# Patient Record
Sex: Male | Born: 1995 | Race: White | Hispanic: No | Marital: Single | Smoking: Never smoker
Health system: Southern US, Community
[De-identification: ages and names within clinical notes are randomized; demographics above are authoritative.]

---
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
## 2021-02-28 ENCOUNTER — Other Ambulatory Visit: Payer: Self-pay

## 2021-02-28 ENCOUNTER — Encounter (HOSPITAL_COMMUNITY): Payer: Self-pay | Admitting: Emergency Medicine

## 2021-02-28 ENCOUNTER — Emergency Department (HOSPITAL_COMMUNITY): Payer: Self-pay

## 2021-02-28 ENCOUNTER — Emergency Department (HOSPITAL_COMMUNITY)
Admission: EM | Admit: 2021-02-28 | Discharge: 2021-03-01 | Disposition: A | Discharge status: Home / Self Care | Payer: Self-pay | Attending: Emergency Medicine | Admitting: Emergency Medicine

## 2021-02-28 DIAGNOSIS — R002 Palpitations: Secondary | ICD-10-CM | POA: Insufficient documentation

## 2021-02-28 DIAGNOSIS — F1910 Other psychoactive substance abuse, uncomplicated: Secondary | ICD-10-CM | POA: Insufficient documentation

## 2021-02-28 DIAGNOSIS — R000 Tachycardia, unspecified: Secondary | ICD-10-CM | POA: Insufficient documentation

## 2021-02-28 LAB — BASIC METABOLIC PANEL
Anion gap: 8 (ref 5–15)
BUN: 13 mg/dL (ref 6–20)
CO2: 29 mmol/L (ref 22–32)
Calcium: 10 mg/dL (ref 8.9–10.3)
Chloride: 100 mmol/L (ref 98–111)
Creatinine, Ser: 0.98 mg/dL (ref 0.61–1.24)
GFR, Estimated: >60 mL/min (ref >60)
Glucose, Bld: 130 mg/dL — ABNORMAL HIGH (ref 70–99)
Potassium: 4.1 mmol/L (ref 3.5–5.1)
Sodium: 137 mmol/L (ref 135–145)

## 2021-02-28 LAB — CBC
HCT: 46.5 % (ref 39.0–52.0)
Hemoglobin: 15.8 g/dL (ref 13.0–17.0)
MCH: 29.4 pg (ref 26.0–34.0)
MCHC: 34 g/dL (ref 30.0–36.0)
MCV: 86.6 fL (ref 80.0–100.0)
Platelets: 230 10*3/uL (ref 150–400)
RBC: 5.37 MIL/uL (ref 4.22–5.81)
RDW: 11.9 % (ref 11.5–15.5)
WBC: 9.7 10*3/uL (ref 4.0–10.5)
nRBC: 0 % (ref 0.0–0.2)

## 2021-02-28 LAB — TROPONIN I (HIGH SENSITIVITY): Troponin I (High Sensitivity): 3 ng/L (ref <18)

## 2021-02-28 NOTE — ED Provider Notes (Signed)
Emergency Medicine Provider Triage Evaluation Note  Taryll Reichenberger , a 25 y.o. male  was evaluated in triage.  Pt complains of his heart racing.  Pt reports he used cocaine and marijuana   Review of Systems  Positive: Chest discomfort Negative: Fever or cough  Physical Exam  BP (!) 153/102 (BP Location: Left Arm)   Pulse (!) 106   Temp 98.2 F (36.8 C) (Oral)   Resp 18   Ht 5\' 11"  (1.803 m)   Wt 68 kg   SpO2 99%   BMI 20.92 kg/m  Gen:   Awake, no distress   Resp:  Normal effort  MSK:   Moves extremities without difficulty  Other:  Hear rate 110   Medical Decision Making  Medically screening exam initiated at 11:13 PM.  Appropriate orders placed.  Devlyn Retter was informed that the remainder of the evaluation will be completed by another provider, this initial triage assessment does not replace that evaluation, and the importance of remaining in the ED until their evaluation is complete.     Joellen Jersey 02/28/21 2314    05/01/21, MD 03/01/21 1919

## 2021-02-28 NOTE — ED Triage Notes (Addendum)
Patient states that he has snorted cocaine and smoked some marijuana. Patient states his heart is beating fast. HR-110. Patient also complains of chest pain on the left side.

## 2021-03-01 LAB — TROPONIN I (HIGH SENSITIVITY): Troponin I (High Sensitivity): 3 ng/L (ref <18)

## 2021-03-01 NOTE — Discharge Instructions (Addendum)
We advise against the use of illicit substances as it may cause recurrence of your symptoms and can be fatal.  Your evaluation in the emergency department was reassuring.  You may return for new or concerning symptoms.

## 2021-03-01 NOTE — ED Provider Notes (Signed)
[facility]-EMERGENCY DEPT Provider Note   CSN: [id number] Arrival date & time: 02/28/21  2152     History Chief Complaint  Patient presents with   Tachycardia    Micheal King is a 25 y.o. male.  25 year old male presents to the emergency department for evaluation of palpitations.  He had onset of his heart beating fast and irregular around 2100.  This was shortly after use of marijuana, though he endorses cocaine use 2 hours prior to symptom onset as well.  No interventions attempted prior to arrival.  He presently is feeling much better.  Denies similar reaction in the past.  No vomiting, syncope, chest pain.  Denies any other coingestions.  The history is provided by the patient. No language interpreter was used.      History reviewed. No pertinent past medical history.  There are no problems to display for this patient.   History reviewed. No pertinent surgical history.     History reviewed. No pertinent family history.  Social History   Tobacco Use   Smoking status: Never    Passive exposure: Never   Smokeless tobacco: Never  Vaping Use   Vaping Use: Every day  Substance Use Topics   Alcohol use: Yes   Drug use: Yes    Types: Cocaine, Marijuana    Home Medications Prior to Admission medications   Medication Sig Start Date End Date Taking? Authorizing Provider  promethazine-dextromethorphan (PROMETHAZINE-DM) 6.25-15 MG/5ML syrup Take 43ml at bedtime for cough 08/23/15  Yes [provider]    Allergies    Azithromycin and Penicillins  Review of Systems   Review of Systems Ten systems reviewed and are negative for acute change, except as noted in the HPI.    Physical Exam Updated Vital Signs BP 109/71   Pulse 61   Temp 98.2 F (36.8 C) (Oral)   Resp 15   Ht 5\' 11"  (1.803 m)   Wt 68 kg   SpO2 97%   BMI 20.92 kg/m   Physical Exam Vitals and nursing note reviewed.  Constitutional:      General: He is not in acute  distress.    Appearance: He is well-developed. He is not diaphoretic.     Comments: Calm and cooperative  HENT:     Head: Normocephalic and atraumatic.  Eyes:     General: No scleral icterus.    Conjunctiva/sclera: Conjunctivae normal.  Cardiovascular:     Rate and Rhythm: Normal rate and regular rhythm.     Pulses: Normal pulses.  Pulmonary:     Effort: Pulmonary effort is normal. No respiratory distress.     Breath sounds: No stridor. No wheezing or rales.     Comments: Respirations even and unlabored. Lungs CTAB. Musculoskeletal:        General: Normal range of motion.     Cervical back: Normal range of motion.  Skin:    General: Skin is warm and dry.     Coloration: Skin is not pale.     Findings: No erythema or rash.  Neurological:     Mental Status: He is alert and oriented to person, place, and time.     Coordination: Coordination normal.  Psychiatric:        Behavior: Behavior normal.    ED Results / Procedures / Treatments   Labs (all labs ordered are listed, but only abnormal results are displayed) Labs Reviewed  BASIC METABOLIC PANEL - Abnormal; Notable for the following components:  Result Value   Glucose, Bld 130 (*)    All other components within normal limits  CBC  TROPONIN I (HIGH SENSITIVITY)  TROPONIN I (HIGH SENSITIVITY)    EKG None  Radiology DG Chest 2 View  Result Date: 02/28/2021 CLINICAL DATA:  Palpitations in left-sided chest pain EXAM: CHEST - 2 VIEW COMPARISON:  None. FINDINGS: The heart size and mediastinal contours are within normal limits. Both lungs are clear. The visualized skeletal structures are unremarkable. IMPRESSION: No active cardiopulmonary disease. Electronically Signed   By: Maudry Mayhew MD   On: 02/28/2021 22:33    Procedures Procedures   Medications Ordered in ED Medications - No data to display  ED Course  I have reviewed the triage vital signs and the nursing notes.  Pertinent labs & imaging results that  were available during my care of the patient were reviewed by me and considered in my medical decision making (see chart for details).    MDM Rules/Calculators/A&P                          Palpitations brought on following polysubstance abuse with cocaine and marijuana.  Presently in normal sinus rhythm and patient reporting resolution of his palpitations.  He states he is feeling much better.  Evaluation in the emergency department has been reassuring with negative troponin x2.  Chest x-ray negative for acute cardiopulmonary abnormality.  EKG on arrival did show fast sinus arrhythmia, but no changes to suggest acute ischemia.  Patient advised to discontinue use of illicit substances.  Stable for discharge to follow-up with a primary care doctor as needed.   Final Clinical Impression(s) / ED Diagnoses Final diagnoses:  Tachycardia  Polysubstance abuse [facility])    Rx / DC Orders ED Discharge Orders     None        Antony Madura, PA-C 03/01/21 Otelia Santee, April, MD 03/01/21 [phone]

## 2021-03-01 NOTE — ED Notes (Signed)
ED Provider at bedside. 

## 2021-09-15 ENCOUNTER — Other Ambulatory Visit: Payer: Self-pay

## 2021-09-15 ENCOUNTER — Encounter (HOSPITAL_COMMUNITY): Payer: Self-pay

## 2021-09-15 ENCOUNTER — Emergency Department (HOSPITAL_COMMUNITY)
Admission: EM | Admit: 2021-09-15 | Discharge: 2021-09-16 | Disposition: A | Discharge status: Home / Self Care | Payer: Self-pay | Attending: Emergency Medicine | Admitting: Emergency Medicine

## 2021-09-15 DIAGNOSIS — W260XXA Contact with knife, initial encounter: Secondary | ICD-10-CM | POA: Insufficient documentation

## 2021-09-15 DIAGNOSIS — S61213A Laceration without foreign body of left middle finger without damage to nail, initial encounter: Secondary | ICD-10-CM | POA: Insufficient documentation

## 2021-09-15 DIAGNOSIS — S61210A Laceration without foreign body of right index finger without damage to nail, initial encounter: Secondary | ICD-10-CM | POA: Insufficient documentation

## 2021-09-15 DIAGNOSIS — Y92000 Kitchen of unspecified non-institutional (private) residence as  the place of occurrence of the external cause: Secondary | ICD-10-CM | POA: Insufficient documentation

## 2021-09-15 NOTE — ED Triage Notes (Signed)
Cut left 2 and 3 finger on kitchen knife doing the dishes.

## 2021-09-15 NOTE — ED Provider Triage Note (Signed)
Emergency Medicine Provider Triage Evaluation Note  Micheal King , a 26 y.o. male  was evaluated in triage.  Pt complains of finger lac of R middle and pointer fingers. Minimal bleeding. No numbness or tingling. Pt thinks his tdap is UTD.   Review of Systems  Positive: Finger lac x2 Negative: numbness  Physical Exam  Ht 5\' 11"  (1.803 m)    Wt 68 kg    BMI 20.92 kg/m  Gen:   Awake, no distress   Resp:  Normal effort  MSK:   Moves extremities without difficulty. 1 cm lac to dorsal L index and middle fingers. Full ROM of each joint when held in isolation. Good distal sensation and cap refill.    Medical Decision Making  Medically screening exam initiated at 8:06 PM.  Appropriate orders placed.  Micheal King was informed that the remainder of the evaluation will be completed by another provider, this initial triage assessment does not replace that evaluation, and the importance of remaining in the ED until their evaluation is complete.  Will need repair   Joellen Jersey, PA-C 09/15/21 2007

## 2021-09-16 MED ORDER — LIDOCAINE HCL (PF) 1 % IJ SOLN
10.0000 mL | Freq: Once | INTRAMUSCULAR | Status: AC
  Administered 2021-09-16: 02:00:00 10 mL
  Filled 2021-09-16: qty 30

## 2021-09-16 NOTE — Discharge Instructions (Signed)
Sutures removed in 7-10 days

## 2021-09-16 NOTE — ED Provider Notes (Signed)
[facility]-EMERGENCY DEPT Provider Note   CSN: [id number] Arrival date & time: 09/15/21  1947     History  Chief Complaint  Patient presents with   Finger Laceration    Micheal King is a 26 y.o. male with no significant past medical history here for evaluation of laceration to digit 2, 3 right upper extremity dorsal aspect between PIP and DIP just PTA while cutting dishes.  Unknown last tetanus however declines updating at this time.  Has had bleeding to third digit.  No paresthesias, weakness, full range of motion without difficulty.  HPI     Home Medications Prior to Admission medications   Medication Sig Start Date End Date Taking? Authorizing Provider  promethazine-dextromethorphan (PROMETHAZINE-DM) 6.25-15 MG/5ML syrup Take 11ml at bedtime for cough 08/23/15   [provider]      Allergies    Azithromycin and Penicillins    Review of Systems   Review of Systems  Constitutional: Negative.   HENT: Negative.    Respiratory: Negative.    Cardiovascular: Negative.   Gastrointestinal: Negative.   Genitourinary: Negative.   Musculoskeletal: Negative.   Skin:  Positive for wound.  Neurological: Negative.   All other systems reviewed and are negative.  Physical Exam Updated Vital Signs BP 134/85    Pulse 86    Temp 98.3 F (36.8 C)    Resp 19    Ht 5\' 11"  (1.803 m)    Wt 68 kg    SpO2 98%    BMI 20.92 kg/m  Physical Exam Vitals and nursing note reviewed.  Constitutional:      General: He is not in acute distress.    Appearance: He is well-developed. He is not ill-appearing, toxic-appearing or diaphoretic.  HENT:     Head: Normocephalic and atraumatic.     Nose: Nose normal.     Mouth/Throat:     Mouth: Mucous membranes are moist.  Eyes:     Pupils: Pupils are equal, round, and reactive to light.  Cardiovascular:     Rate and Rhythm: Normal rate and regular rhythm.     Pulses: Normal pulses.     Heart sounds: Normal heart  sounds.  Pulmonary:     Effort: Pulmonary effort is normal. No respiratory distress.  Abdominal:     General: There is no distension.     Palpations: Abdomen is soft.  Musculoskeletal:        General: Normal range of motion.     Right hand: Laceration present. No swelling, tenderness or bony tenderness. Normal range of motion.     Left hand: Normal.       Hands:     Cervical back: Normal range of motion and neck supple.     Comments: Full range of motion, nontender  Skin:    General: Skin is warm and dry.     Comments: 8 millimeters laceration on second and third digit between PIP, DIP right upper extremity.  No active bleeding to second digit, slow ooze to third digit.  No evidence of foreign object.  No nailbed involvement  Neurological:     General: No focal deficit present.     Mental Status: He is alert and oriented to person, place, and time.     Comments: Intact sensation Equal strength    ED Results / Procedures / Treatments   Labs (all labs ordered are listed, but only abnormal results are displayed) Labs Reviewed - No data to display  EKG None  Radiology No results found.  Procedures .Marland Kitchen.Laceration Repair  Date/Time: 09/16/2021 2:05 AM Performed by: Ralph LeydenHenderly, Aviya Jarvie A, PA-C Authorized by: Linwood DibblesHenderly, Hager Compston A, PA-C   Consent:    Consent obtained:  Verbal   Consent given by:  Patient   Risks discussed:  Infection, need for additional repair, pain, poor cosmetic result and poor wound healing   Alternatives discussed:  No treatment and delayed treatment Universal protocol:    Procedure explained and questions answered to patient or proxy's satisfaction: yes     Relevant documents present and verified: yes     Test results available: yes     Imaging studies available: yes     Required blood products, implants, devices, and special equipment available: yes     Site/side marked: yes     Immediately prior to procedure, a time out was called: yes     Patient  identity confirmed:  Verbally with patient Anesthesia:    Anesthesia method:  Local infiltration and nerve block   Local anesthetic:  Lidocaine 1% w/o epi   Block location:  Digital   Block anesthetic:  Lidocaine 1% w/o epi   Block injection procedure:  Anatomic landmarks identified, anatomic landmarks palpated, introduced needle, negative aspiration for blood and incremental injection   Block outcome:  Anesthesia achieved Laceration details:    Location:  Finger   Finger location:  R long finger   Length (cm):  1 Pre-procedure details:    Preparation:  Patient was prepped and draped in usual sterile fashion and imaging obtained to evaluate for foreign bodies Exploration:    Hemostasis achieved with:  Direct pressure   Imaging outcome: foreign body not noted     Wound exploration: wound explored through full range of motion and entire depth of wound visualized     Wound extent: no foreign bodies/material noted, no muscle damage noted, no nerve damage noted, no tendon damage noted, no underlying fracture noted and no vascular damage noted     Contaminated: no   Treatment:    Area cleansed with:  Povidone-iodine   Amount of cleaning:  Extensive   Irrigation solution:  Sterile saline   Irrigation method:  Pressure wash Skin repair:    Repair method:  Sutures   Suture size:  4-0   Suture material:  Prolene   Suture technique:  Simple interrupted   Number of sutures:  3 Approximation:    Approximation:  Close Repair type:    Repair type:  Intermediate Post-procedure details:    Dressing:  Non-adherent dressing   Procedure completion:  Tolerated well, no immediate complications .Nerve Block  Date/Time: 09/16/2021 2:06 AM Performed by: Linwood DibblesHenderly, Manika Hast A, PA-C Authorized by: Linwood DibblesHenderly, Jorah Hua A, PA-C   Consent:    Consent obtained:  Verbal   Consent given by:  Patient   Risks, benefits, and alternatives were discussed: yes     Risks discussed:  Allergic reaction, infection,  nerve damage, swelling, unsuccessful block, pain, intravenous injection and bleeding   Alternatives discussed:  No treatment, delayed treatment, alternative treatment and referral Universal protocol:    Procedure explained and questions answered to patient or proxy's satisfaction: yes     Relevant documents present and verified: yes     Test results available: yes     Imaging studies available: yes     Required blood products, implants, devices, and special equipment available: yes     Site/side marked: yes     Immediately prior to procedure, a time out was called:  yes     Patient identity confirmed:  Verbally with patient Indications:    Indications:  Procedural anesthesia Location:    Body area:  Upper extremity   Upper extremity nerve blocked: digital. Pre-procedure details:    Skin preparation:  Alcohol   Preparation: Patient was prepped and draped in usual sterile fashion   Skin anesthesia:    Skin anesthesia method:  None Procedure details:    Anesthetic injected:  Lidocaine 1% w/o epi   Steroid injected:  None   Additive injected:  None   Injection procedure:  Anatomic landmarks identified, anatomic landmarks palpated, incremental injection, introduced needle and negative aspiration for blood Post-procedure details:    Outcome:  Anesthesia achieved   Procedure completion:  Tolerated well, no immediate complications .Marland Kitchen.Laceration Repair  Date/Time: 09/16/2021 2:07 AM Performed by: Ralph LeydenHenderly, Galena Logie A, PA-C Authorized by: Linwood DibblesHenderly, Aleah Ahlgrim A, PA-C   Consent:    Consent obtained:  Verbal   Consent given by:  Patient   Risks discussed:  Infection, need for additional repair, pain, poor cosmetic result and poor wound healing   Alternatives discussed:  No treatment and delayed treatment Universal protocol:    Procedure explained and questions answered to patient or proxy's satisfaction: yes     Relevant documents present and verified: yes     Test results available: yes      Imaging studies available: yes     Required blood products, implants, devices, and special equipment available: yes     Site/side marked: yes     Immediately prior to procedure, a time out was called: yes     Patient identity confirmed:  Verbally with patient Anesthesia:    Anesthesia method:  None Laceration details:    Location:  Finger   Finger location:  R index finger   Length (cm):  0.8   Depth (mm):  3 Pre-procedure details:    Preparation:  Patient was prepped and draped in usual sterile fashion and imaging obtained to evaluate for foreign bodies Exploration:    Hemostasis achieved with:  Direct pressure   Imaging outcome: foreign body not noted     Wound exploration: wound explored through full range of motion and entire depth of wound visualized     Wound extent: no foreign bodies/material noted, no muscle damage noted, no tendon damage noted, no underlying fracture noted and no vascular damage noted     Contaminated: no   Treatment:    Area cleansed with:  Povidone-iodine   Amount of cleaning:  Extensive   Irrigation solution:  Sterile saline   Irrigation method:  Pressure wash Skin repair:    Repair method:  Tissue adhesive Approximation:    Approximation:  Close Repair type:    Repair type:  Intermediate Post-procedure details:    Dressing:  Non-adherent dressing   Procedure completion:  Tolerated well, no immediate complications    Medications Ordered in ED Medications  lidocaine (PF) (XYLOCAINE) 1 % injection 10 mL (10 mLs Infiltration Given by Other 09/16/21 0142)   ED Course/ Medical Decision Making/ A&P    26 year old here for evaluation #2 lacerations between PIP, DIP on dorsal aspect second/third digit right upper extremity PTA.  Unknown last tetanus however patient declines updating today.  He understands risk involved in updating tetanus.  He is neurovascular intact.  No bony tenderness.  No pulsatile bleeding.  Do not feel patient needs imaging at this  time.  We will plan on closing.  Was able to perform digital block  to the third digit RUE and closed with #3 Prolene sutures, second digit laceration closed with dermabond.  We will have him follow-up for removal, discussed return precautions.  Patient voiced understanding agreeable for follow-up  The patient has been appropriately medically screened and/or stabilized in the ED. I have low suspicion for any other emergent medical condition which would require further screening, evaluation or treatment in the ED or require inpatient management.  Patient is hemodynamically stable and in no acute distress.  Patient able to ambulate in department prior to ED.  Evaluation does not show acute pathology that would require ongoing or additional emergent interventions while in the emergency department or further inpatient treatment.  I have discussed the diagnosis with the patient and answered all questions.  Pain is been managed while in the emergency department and patient has no further complaints prior to discharge.  Patient is comfortable with plan discussed in room and is stable for discharge at this time.  I have discussed strict return precautions for returning to the emergency department.  Patient was encouraged to follow-up with PCP/specialist refer to at discharge.                            Medical Decision Making Risk OTC drugs. Prescription drug management. Risk Details: Do not feel patient needs labs, imaging and hospitalization at this time.         Final Clinical Impression(s) / ED Diagnoses Final diagnoses:  Laceration of right index finger without foreign body without damage to nail, initial encounter  Laceration of left middle finger without foreign body without damage to nail, initial encounter    Rx / DC Orders ED Discharge Orders     None         Jenniger Figiel A, PA-C 09/16/21 0220    Sabas Sous, MD 09/16/21 [phone]

## 2021-11-25 IMAGING — CR DG CHEST 2V
2 series · 2 of 2 positions shown · non-contrast
Comparison: None.

CLINICAL DATA: Palpitations in left-sided chest pain

EXAM:
CHEST - 2 VIEW

[w chest pa]
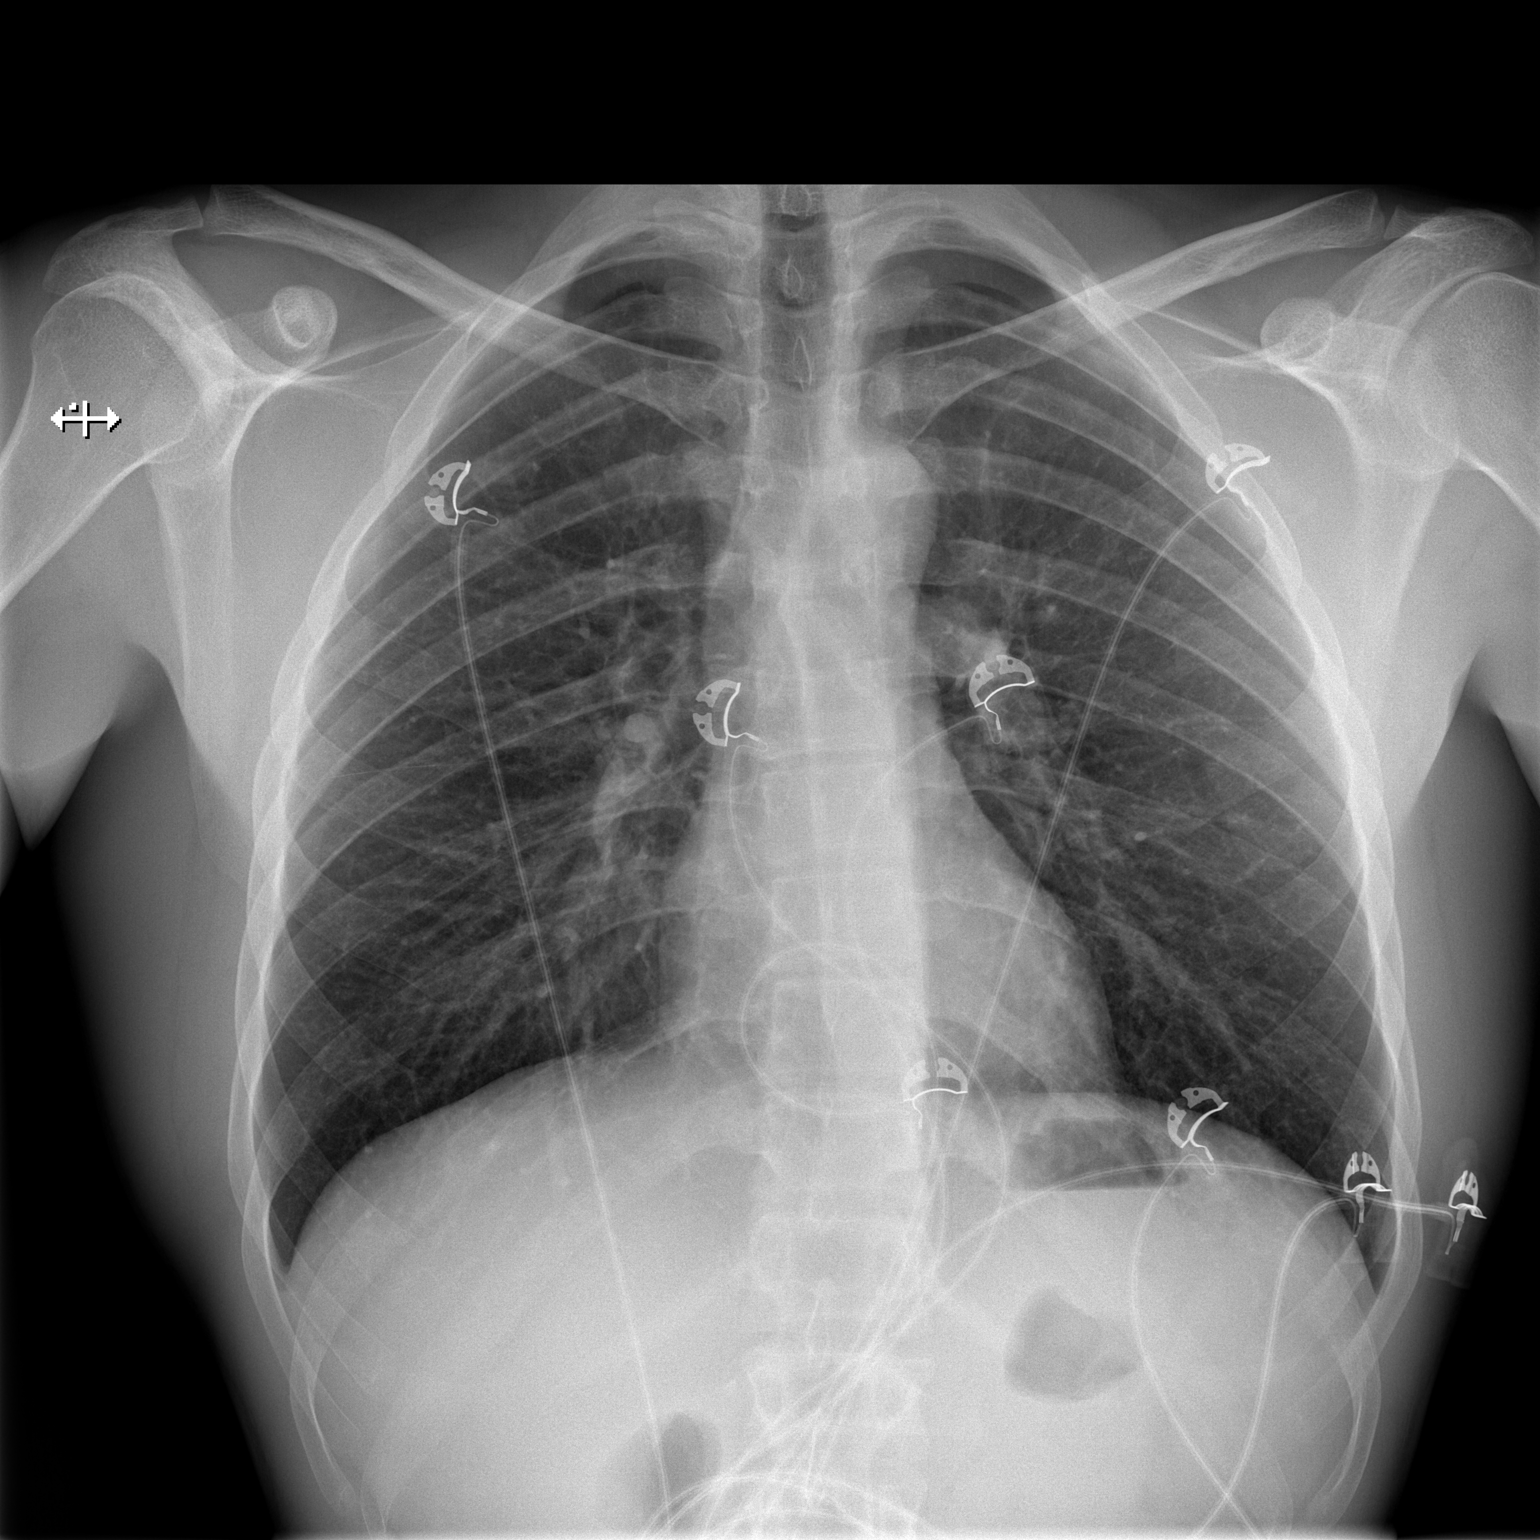

[w chest lat]
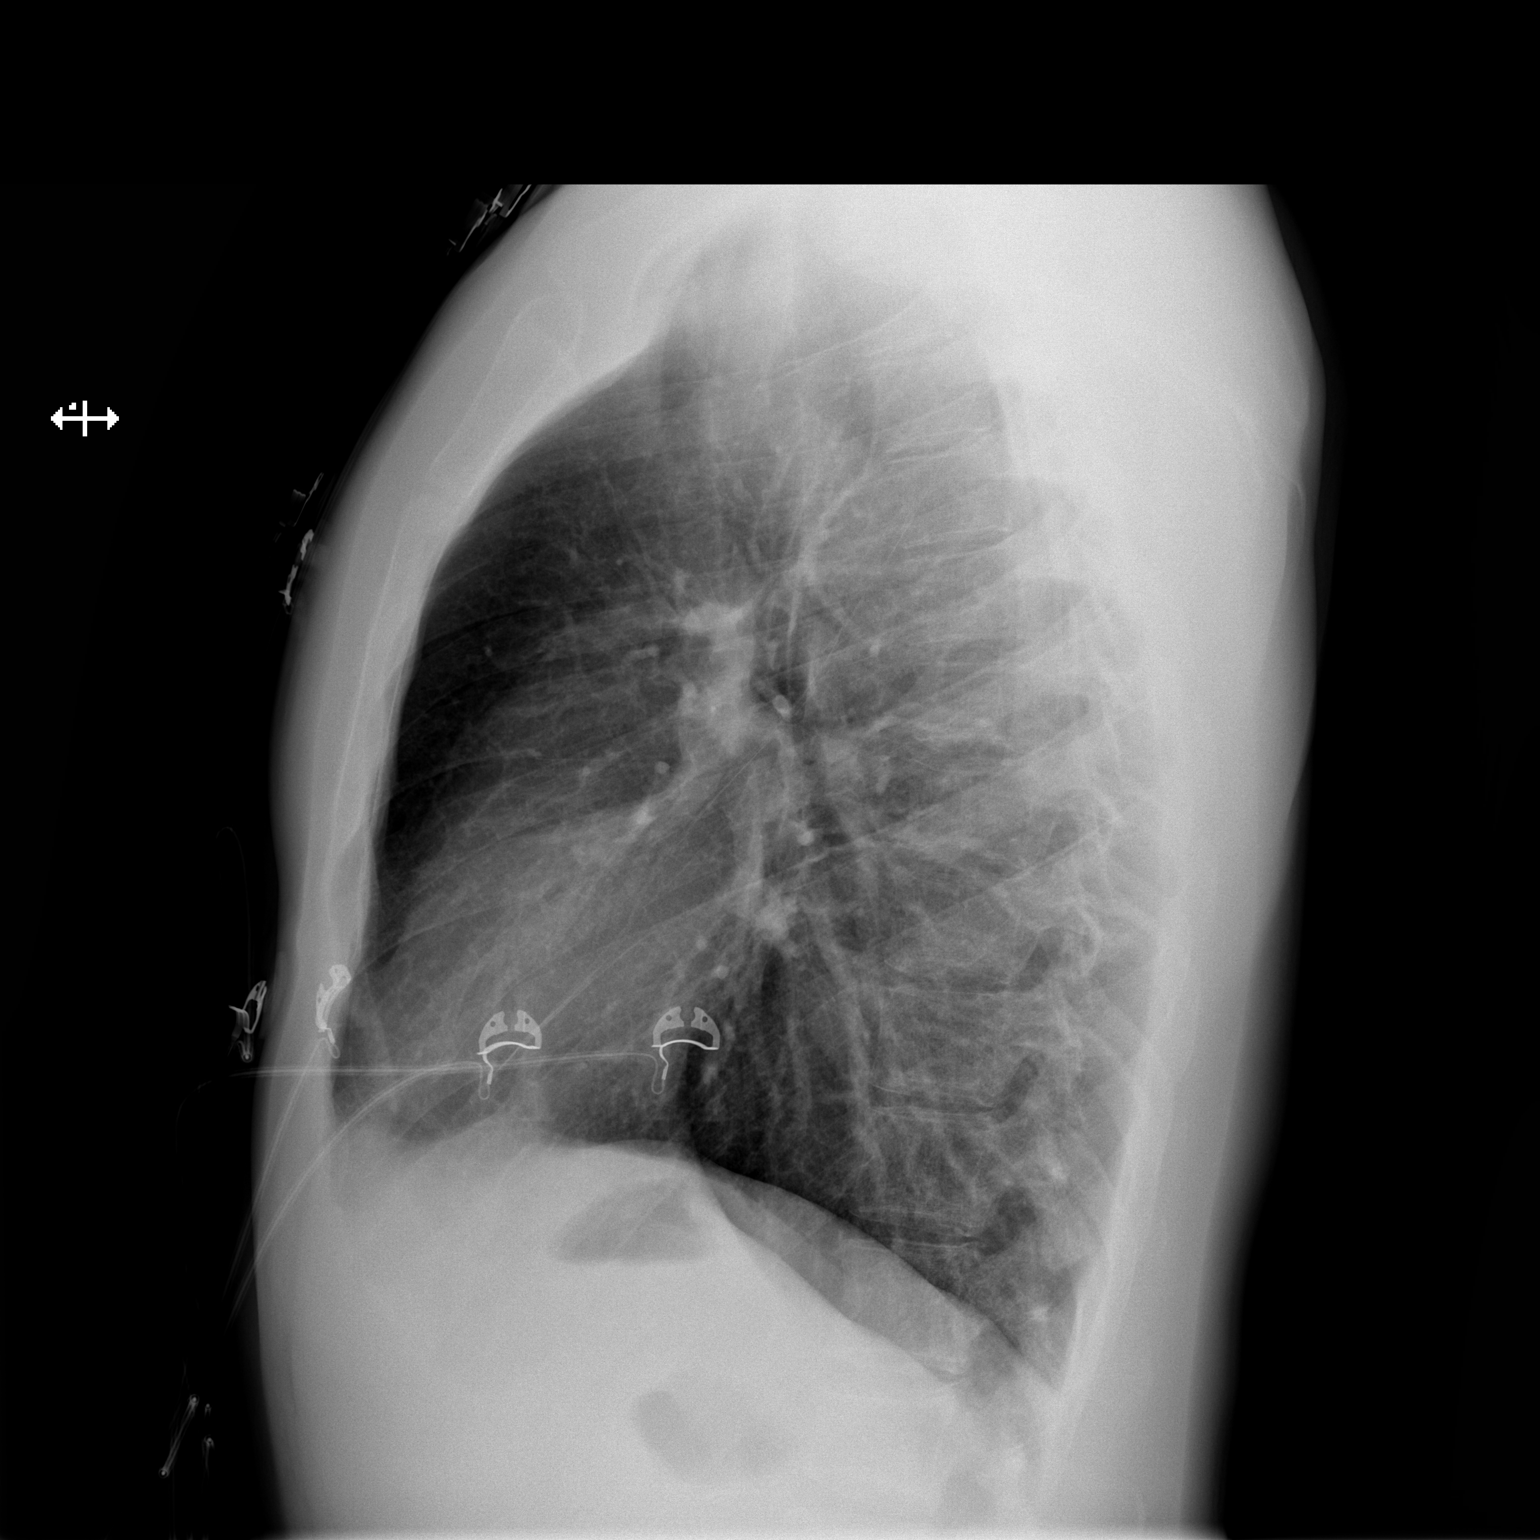

[2 of 2 positions shown; findings below may reference images not displayed]

FINDINGS: The heart size and mediastinal contours are within normal limits.
Both lungs are clear. The visualized skeletal structures are
unremarkable.
IMPRESSION: No active cardiopulmonary disease.

## 2023-01-21 ENCOUNTER — Emergency Department
Admission: EM | Admit: 2023-01-21 | Discharge: 2023-01-21 | Disposition: A | Discharge status: Home / Self Care | Payer: Self-pay | Attending: Student in an Organized Health Care Education/Training Program | Admitting: Student in an Organized Health Care Education/Training Program

## 2023-01-21 ENCOUNTER — Other Ambulatory Visit: Payer: Self-pay

## 2023-01-21 DIAGNOSIS — Z041 Encounter for examination and observation following transport accident: Secondary | ICD-10-CM | POA: Insufficient documentation

## 2023-01-21 DIAGNOSIS — Y92410 Unspecified street and highway as the place of occurrence of the external cause: Secondary | ICD-10-CM | POA: Diagnosis not present

## 2023-01-21 LAB — URINE DRUG SCREEN, QUALITATIVE (ARMC ONLY)
Amphetamines, Ur Screen: NOT DETECTED
Barbiturates, Ur Screen: NOT DETECTED
Benzodiazepine, Ur Scrn: NOT DETECTED
Cannabinoid 50 Ng, Ur ~~LOC~~: NOT DETECTED
Cocaine Metabolite,Ur ~~LOC~~: NOT DETECTED
MDMA (Ecstasy)Ur Screen: NOT DETECTED
Methadone Scn, Ur: NOT DETECTED
Opiate, Ur Screen: NOT DETECTED
Phencyclidine (PCP) Ur S: NOT DETECTED
Tricyclic, Ur Screen: NOT DETECTED

## 2023-01-21 NOTE — Discharge Instructions (Addendum)
Please alternate Tylenol and ibuprofen as needed for any pain, muscle soreness.  Return to the ER for any severe pain, weakness, headaches, numbness, tingling, worsening symptoms or any urgent changes in health.
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
